# Patient Record
Sex: Male | Born: 1965 | Race: White | Hispanic: No | Marital: Married | Smoking: Former smoker
Health system: Southern US, Community
[De-identification: ages and names within clinical notes are randomized; demographics above are authoritative.]

## PROBLEM LIST (undated history)

## (undated) DIAGNOSIS — I2510 Atherosclerotic heart disease of native coronary artery without angina pectoris: Secondary | ICD-10-CM

## (undated) DIAGNOSIS — E785 Hyperlipidemia, unspecified: Secondary | ICD-10-CM

## (undated) DIAGNOSIS — I10 Essential (primary) hypertension: Secondary | ICD-10-CM

## (undated) DIAGNOSIS — I201 Angina pectoris with documented spasm: Secondary | ICD-10-CM

## (undated) HISTORY — DX: Atherosclerotic heart disease of native coronary artery without angina pectoris: I25.10

## (undated) HISTORY — PX: CARDIAC CATHETERIZATION: SHX172

## (undated) HISTORY — DX: Hyperlipidemia, unspecified: E78.5

## (undated) HISTORY — DX: Essential (primary) hypertension: I10

## (undated) HISTORY — DX: Angina pectoris with documented spasm: I20.1

---

## 2003-07-02 ENCOUNTER — Ambulatory Visit (HOSPITAL_COMMUNITY): Admission: RE | Admit: 2003-07-02 | Discharge: 2003-07-02 | Payer: Self-pay | Admitting: Internal Medicine

## 2003-07-02 ENCOUNTER — Encounter: Payer: Self-pay | Admitting: Internal Medicine

## 2003-12-07 ENCOUNTER — Encounter (HOSPITAL_COMMUNITY): Admission: RE | Admit: 2003-12-07 | Discharge: 2003-12-10 | Payer: Self-pay | Admitting: Internal Medicine

## 2004-12-02 ENCOUNTER — Ambulatory Visit: Payer: Self-pay | Admitting: Gastroenterology

## 2004-12-08 ENCOUNTER — Ambulatory Visit: Payer: Self-pay | Admitting: Gastroenterology

## 2008-06-13 ENCOUNTER — Ambulatory Visit: Payer: Self-pay | Admitting: Cardiology

## 2008-06-13 ENCOUNTER — Ambulatory Visit (HOSPITAL_COMMUNITY): Admission: RE | Admit: 2008-06-13 | Discharge: 2008-06-13 | Payer: Self-pay | Admitting: Cardiology

## 2008-06-14 ENCOUNTER — Ambulatory Visit: Payer: Self-pay | Admitting: Cardiology

## 2008-06-14 ENCOUNTER — Ambulatory Visit (HOSPITAL_COMMUNITY): Admission: RE | Admit: 2008-06-14 | Discharge: 2008-06-15 | Payer: Self-pay | Admitting: Cardiology

## 2009-02-20 ENCOUNTER — Encounter (HOSPITAL_COMMUNITY): Admission: RE | Admit: 2009-02-20 | Discharge: 2009-03-22 | Payer: Self-pay | Admitting: Cardiology

## 2009-02-20 ENCOUNTER — Ambulatory Visit: Payer: Self-pay | Admitting: Cardiology

## 2009-09-06 IMAGING — CR DG CERVICAL SPINE COMPLETE 4+V
6 series · 6 of 6 positions shown · non-contrast
Comparison: None

CLINICAL DATA: Neck pain

CERVICAL SPINE - COMPLETE 4+ VIEW

[view not recorded (1 of 6)]
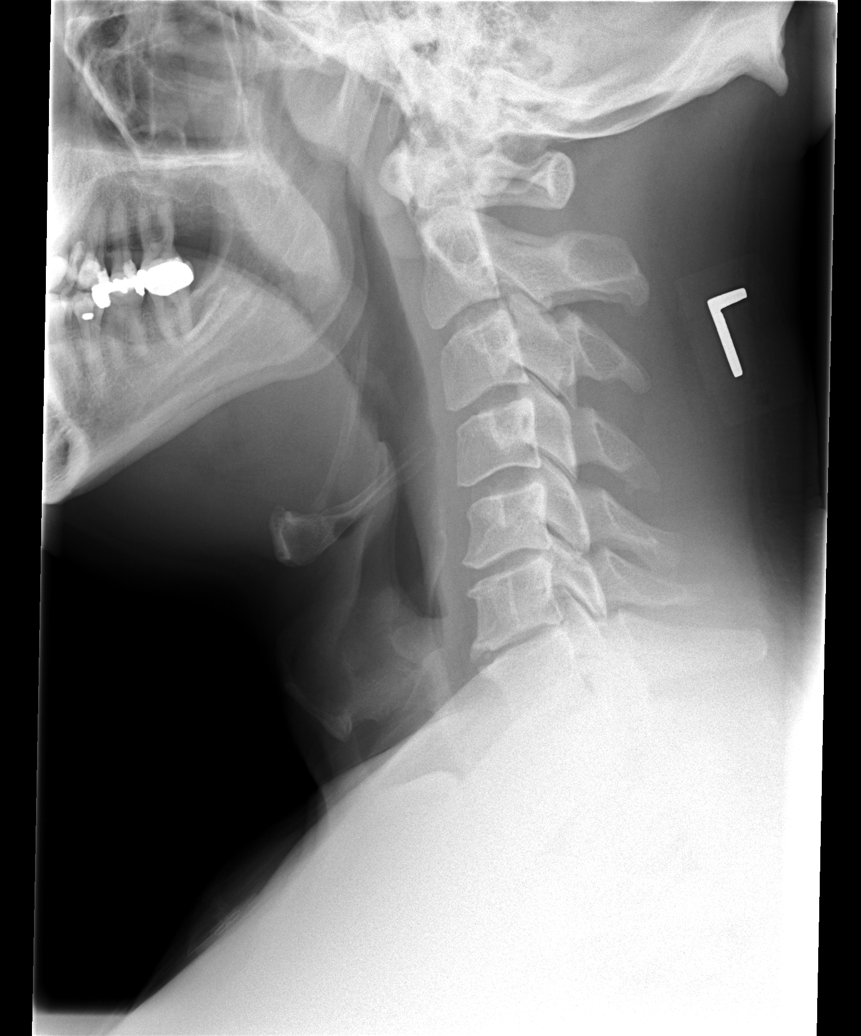

[view not recorded (2 of 6)]
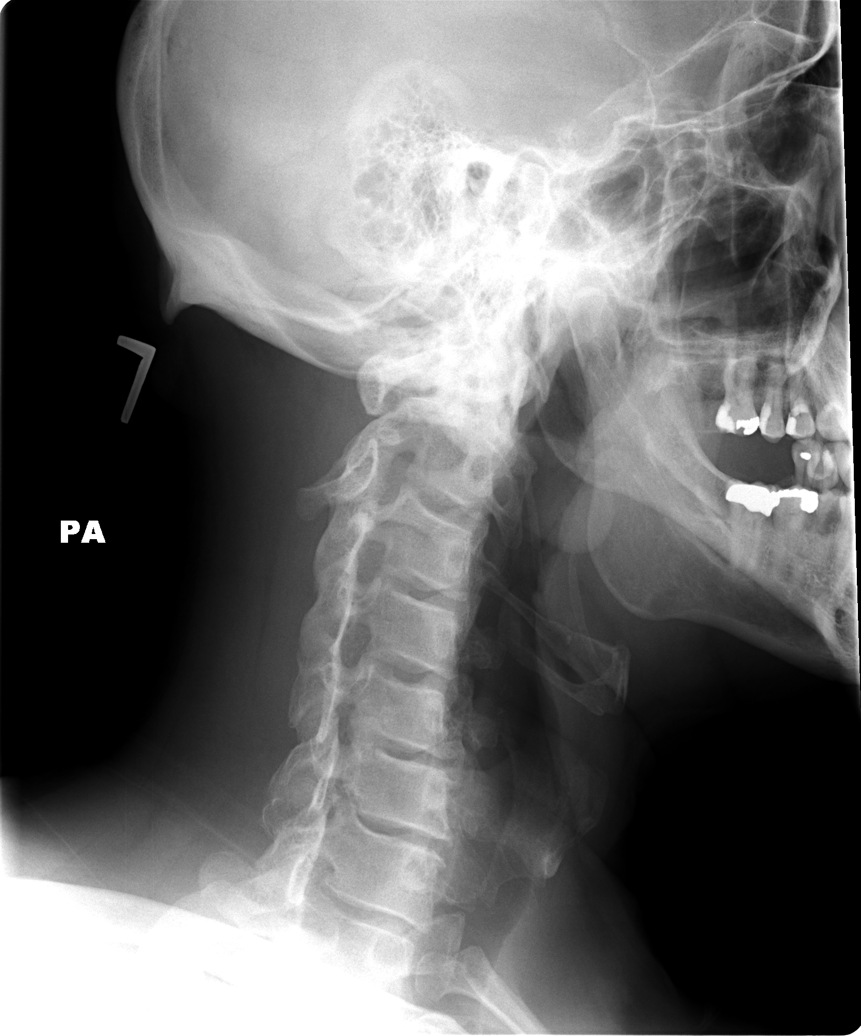

[view not recorded (3 of 6)]
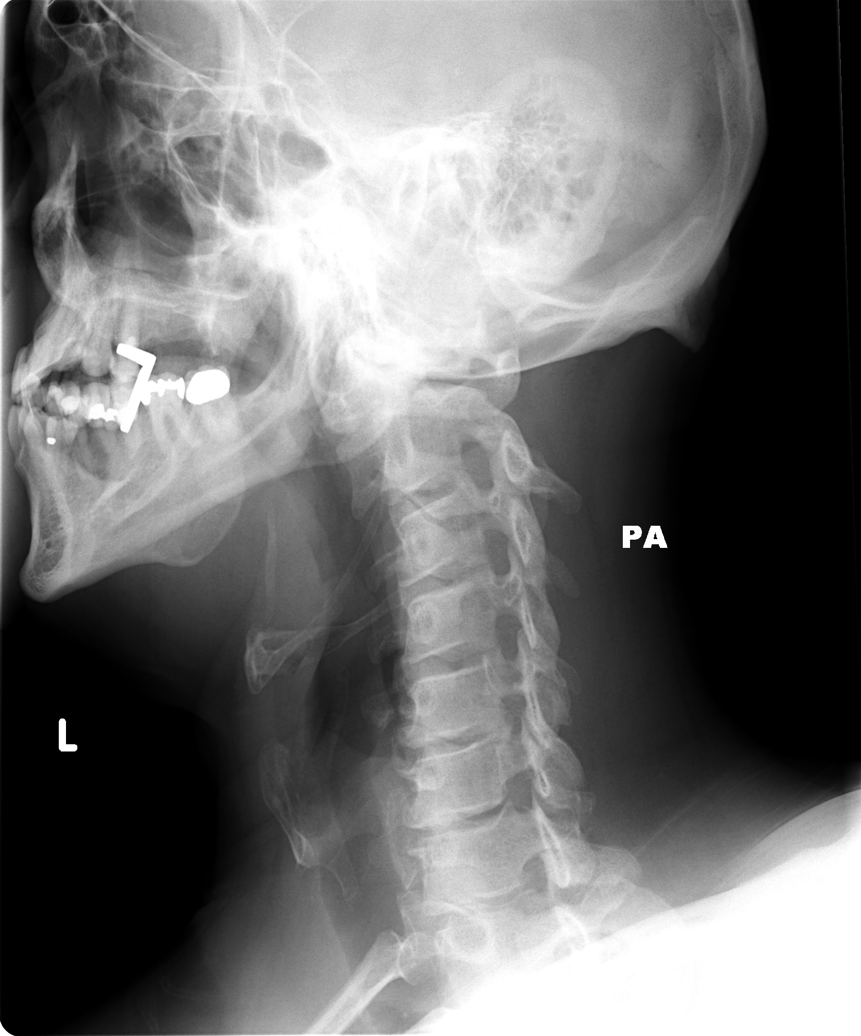

[view not recorded (4 of 6)]
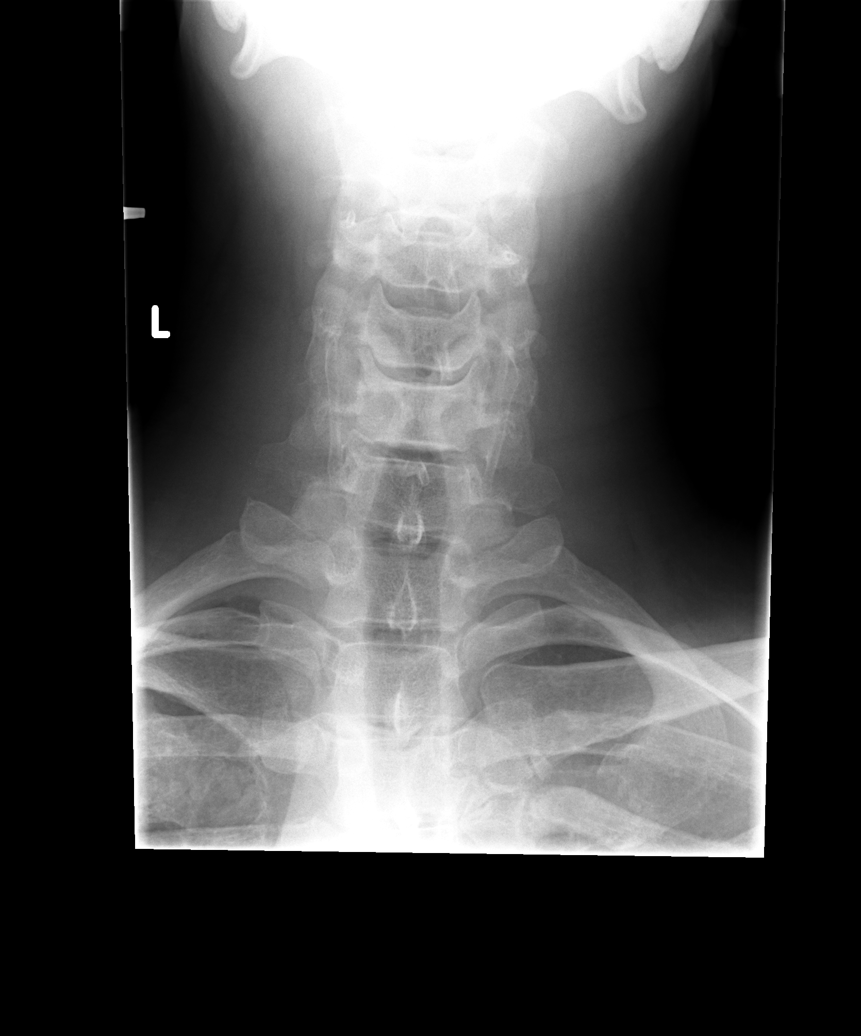

[view not recorded (5 of 6)]
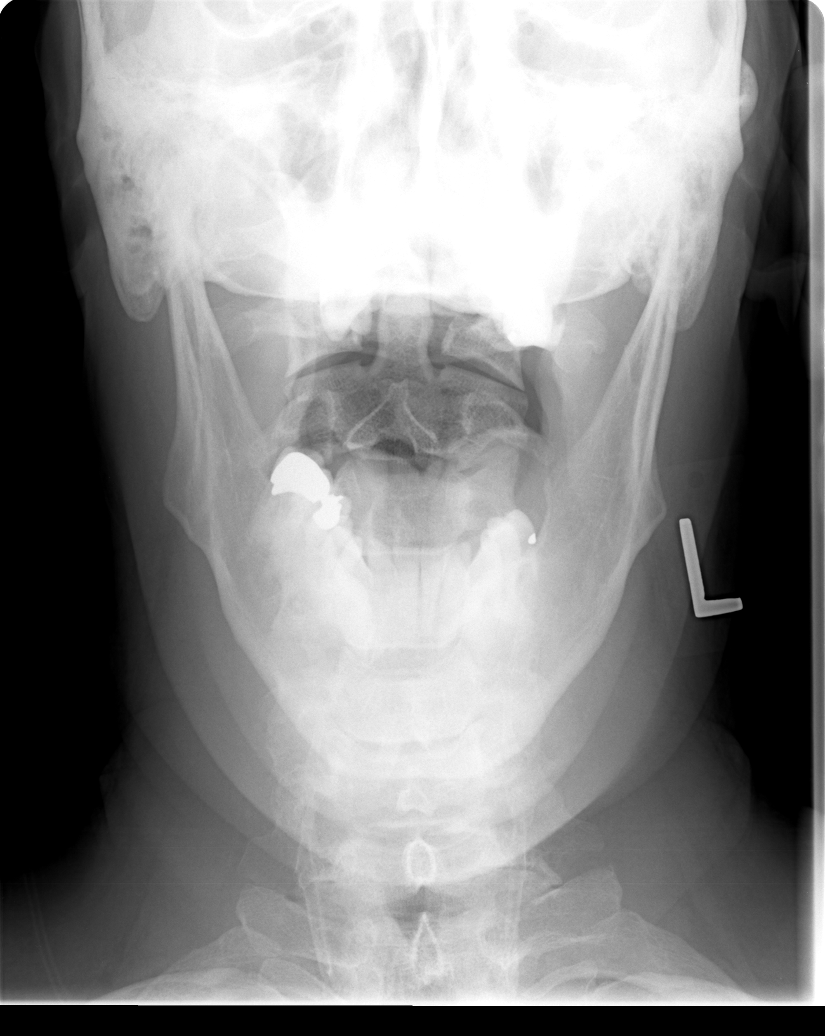

[view not recorded (6 of 6)]
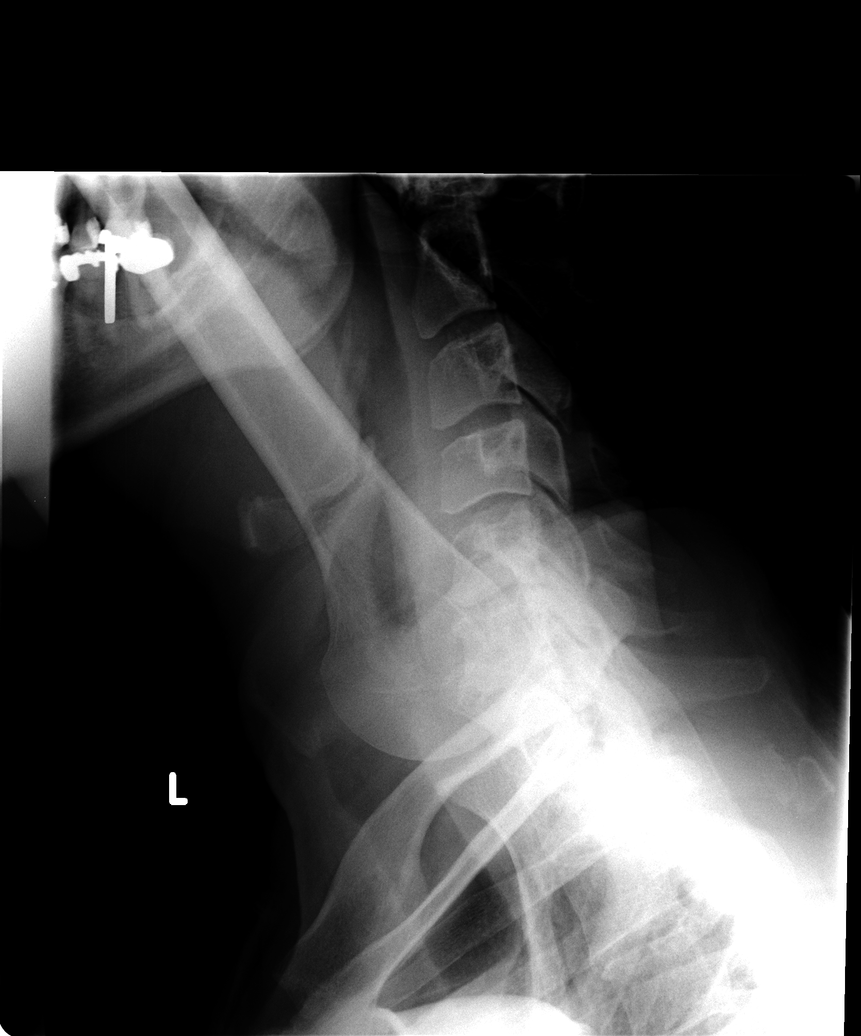

[6 of 6 positions shown; findings below may reference images not displayed]

FINDINGS: Mild reversal of cervical lordosis question muscle spasm.
Slight disc space narrowing at C5-C6 and C6-C7.
Minimally prominent adenoids.
Prevertebral soft tissues normal thickness.
Minimal narrowing of bilateral C5-C6 neural foramina by
uncovertebral hypertrophy.
No acute fracture, subluxation, or bone destruction.
Visualized lung apices clear.
C1-C2 alignment normal.
IMPRESSION: Mild degenerative changes of cervical spine as above.
Question muscle spasm.

## 2010-04-08 ENCOUNTER — Encounter (INDEPENDENT_AMBULATORY_CARE_PROVIDER_SITE_OTHER): Payer: Self-pay | Admitting: *Deleted

## 2010-07-16 ENCOUNTER — Encounter (INDEPENDENT_AMBULATORY_CARE_PROVIDER_SITE_OTHER): Payer: Self-pay | Admitting: *Deleted

## 2010-11-11 NOTE — Letter (Signed)
Summary: Appointment - Reminder 2  [facility] at [place]. [address] [place] [address]   Phone: [phone]  Fax: [phone]     April 08, 2010 MRN: [id number]   Riley Williams [address] [place], [place]  [address]   Dear [name],  Our records indicate that it is time to schedule a follow-up appointment.  Dr. Daleen Squibb         recommended that you follow up with [place] in    MAY 2011        . It is very important that we reach you to schedule this appointment. We look forward to participating in your health care needs. Please contact us at the number listed above at your earliest convenience to schedule your appointment.  If you are unable to make an appointment at this time, give [place] a call so we can update our records.     Sincerely,   Glass blower/designer

## 2010-11-11 NOTE — Letter (Signed)
Summary: Appointment - Reminder 2  [facility] [place]. [address] [place] [address]   Phone: [phone]  Fax: [phone]     July 16, 2010 MRN: [id number]   Riley Williams [address] [place], [place]  [address]   Dear [name],  Our records indicate that it is time to schedule a follow-up appointment.  Dr.   Daleen Squibb       recommended that you follow up with [place] in    02/2010        . It is very important that we reach you to schedule this appointment. We look forward to participating in your health care needs. Please contact us at the number listed above at your earliest convenience to schedule your appointment.  If you are unable to make an appointment at this time, give [place] a call so we can update our records.     Sincerely,   Glass blower/designer

## 2011-02-24 NOTE — H&P (Signed)
NAMEKYLAND, NO                ACCOUNT NO.:  [id number]   MEDICAL RECORD NO.:  [id number]         PATIENT TYPE:  COIB   LOCATION:  2027                          [facility]   PHYSICIAN:  Jesse Sans. Wall, MD, FACCDATE OF BIRTH:  01/20/1966   DATE OF ADMISSION:  06/14/2008  DATE OF DISCHARGE:                              HISTORY & PHYSICAL   CHIEF COMPLAINT:  Chest discomfort and shortness of breath.   HISTORY OF PRESENT ILLNESS:  Mr. Riley Williams [facility]) is a 45 year old  friend of mine, who called me this morning with the above complaints.  I  asked him to come to the office here in [place].   Over the past month, he has been having exertional chest tightness and  shortness of breath.  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   Yesterday he was riding his tractor in the field and suddenly became  very hot and felt nauseated.  He broke out in a sweat.  He said he lost  consciousness for about 4 or 5 seconds.  He thought he was going to die  in the field.  After that, he became nauseated and had diarrhea.  His  only change in his normal routine is that he had an energy drink about 3  or 4 hours before that.   He has multiple cardiac risk factors for coronary disease.  This  includes increasing age, male sex, family history with his father having  coronary disease at about age 42, tobacco use of a pack per day, but  heavier in the past, and some recreational drug use in the distant past.  He is not diabetic and does not have hypertension.  His cholesterol he  thinks is okay.Marland Kitchen   PAST MEDICAL HISTORY:  He had a colonoscopy in the past.   ALLERGIES:  He has no known drug allergies.   MEDICATIONS:  He is currently on no medications.   SOCIAL HISTORY:  He is married and has one child.  Lives in [place].  He enjoys hunting and golf.  He is an Financial trader.   FAMILY HISTORY:  His father has had bypass surgery and coronary  stents.  His mother is alive and well with no coronary disease.  His brother also  has no history of coronary disease, but does have a history of SVT and a  heart murmur.  Sister is alive and well.   REVIEW OF SYSTEMS:  Negative other than the HPI.  He occasionally will  have some palpitations.  All systems were reviewed.   PHYSICAL EXAMINATION:  GENERAL:  He is a well-developed, well-nourished  muscular male in no acute distress.  VITAL SIGNS:  His blood pressure is 150/90, his pulse is 70 and regular,  his weight is 264, he is 6 feet 5.  HEENT: Normocephalic, atraumatic.  PERRLA.  Extraocular movements  intact.  Sclerae are clear.  Facial symmetry is normal.  Carotids  upstrokes are equal bilaterally without bruits.  No JVD.  Thyroid is not  enlarged.  Trachea is midline.  LUNGS:  Clear to auscultation and percussion.  HEART:  Reveals a nondisplaced PMI.  There is a normal S1 and S2.  No  murmur, rub, or gallop.  ABDOMEN:  Soft.  No midline bruit.  No pulsatile mass.  No hepatomegaly.  EXTREMITIES:  No cyanosis, clubbing, or edema.  Pulses are intact.  NEURO EXAM:  Intact.  SKIN:  Unremarkable.   EKG shows normal sinus rhythm with no ST-segment changes.  He has a  significant sinus arrhythmia and occasional PAC.   ASSESSMENT:  1. Exertional angina, rule out obstructive coronary artery disease.  2. Multiple cardiac risk stress test for coronary disease.  See above      notes.   PLAN:  Outpatient cardiac catheterization at [facility] tomorrow.  Indications, risks, and potential benefits have been discussed.  I  started him on an aspirin today.  He knows that if he has an episode of  this this evening that he should call 911.      Thomas C. Daleen Squibb, MD, [facility]  Electronically Signed     TCW/MEDQ  D:  06/13/2008  T:  06/13/2008  Job:  [id number]   cc:   Kingsley Callander. Ouida Sills, MD

## 2011-02-24 NOTE — Procedures (Signed)
[facility]                              EXERCISE TREADMILL   Riley, Williams                       MRN:          [id number]  DATE:02/20/2009                            DOB:          1966/04/05    INDICATIONS FOR STUDY:  Chest discomfort 786.59.  He has a history of  catheter-induced ostial right coronary artery spasm by cath, September  2009.   Baseline blood pressure was 122/72.   Baseline EKG was normal.   He exercised for 9 minutes on an accelerated protocol to achieve higher  heart rate.  His max MET level was 13.4.  His peak heart rate was only  136 which is 76% of maximum predicted heart rate.  Blood pressure  increased appropriately to 178/78.  The test was stopped secondary to  fatigue.  He reported no chest pain or arm pain.   His EKG was completely normal.   CONCLUSION:  1. Negative and adequate exercise treadmill.  2. No symptoms at peak exercise nor EKG changes.  3. Excellent physical conditioning.  4. Good pressure control at rest and with exercise.  5. Myoview images pending.     Thomas C. Daleen Squibb, MD, [facility]  Electronically Signed    TCW/MedQ  DD: 02/20/2009  DT: 02/21/2009  Job #: [id number]   cc:   Kingsley Callander. Ouida Sills, MD

## 2011-02-24 NOTE — Cardiovascular Report (Signed)
NAMESOSAIA, PITTINGER                ACCOUNT NO.:  [id number]   MEDICAL RECORD NO.:  [id number]          PATIENT TYPE:  OIB   LOCATION:  2027                         FACILITY:  [facility]   PHYSICIAN:  Arturo Morton. Riley Kill, MD, FACCDATE OF BIRTH:  07-10-1966   DATE OF PROCEDURE:  06/14/2008  DATE OF DISCHARGE:                            CARDIAC CATHETERIZATION   INDICATIONS:  Mr. Riley Williams is a 45 year old who had chest pain and episode  of syncope.  The current study was done to assess coronary anatomy.  He  was seen by Dr. Daleen Squibb in consultation and set up for the study.  Risks,  benefits, and alternatives were discussed with the patient who consented  to proceed.   PROCEDURES:  1. Left heart catheterization.  2. Selective coronary arteriography.  3. Selective left ventriculography.   DESCRIPTION OF PROCEDURE:  The patient was brought to the  catheterization laboratory and prepped and draped in usual fashion.  Through an anterior puncture, the femoral artery was easily entered.  A  5-French sheath was then placed.  Following this, views of the left  coronary artery were obtained.  Following injection of the right  coronary artery, he had modest narrowing in the proximal vessel and the  second injection suggested a 95% stenosis suggesting dynamic change in  the coronary vessel.  Intracoronary nitroglycerin was then given in 2  doses of 150 mcg apiece.  With this, the vessel completely normalized  and there was wide flow down the vessel.  The central aortic and left  ventricular pressures were measured with pigtail and ventriculography  was then performed in the RAO projection.  Following a pressure  pullback, the pigtail catheter was subsequently removed.  It was taken  to the holding area in satisfactory clinical condition for sheath  removal.  Following this, I reviewed the films in detail with the  patient's family.  There were no complications.   HEMODYNAMIC DATA:  1. Central aortic  pressure 124/76, mean 99.  2. Left ventricular pressure 113/8.  3. There was no gradient or pullback across the aortic valve.   ANGIOGRAPHIC DATA:  1. Ventriculography was done in the RAO projection.  Overall systolic      function was preserved.  Ejection fraction was in excess of 55%      without wall motion abnormality.  2. The left main is short and without significant obstruction.  There      were functionally separate ostia.  3. The left anterior descending artery crosses to the apex.  There is      some very mild area of perhaps some bridging as well as mild      irregularity in the mid-to-distal vessel of perhaps 20% past the      diagonal.  The diagonal itself is without critical narrowing.  No      areas of high-grade obstruction are noted.  4. The circumflex coronary artery provides a very large marginal      branch which has segmental plaquing of about 20-30% over a modest      area in the proximal marginal.  The  marginal then opens up and is      widely patent.  The AV circumflex provides a posterolateral branch,      which also was without significant focal obstruction.  5. The right coronary artery is a moderate-sized vessel.  After      intracoronary nitroglycerin was administered, it is a very large-      caliber vessel that is widely patent.  On the initial injection,      there was about 20-30% segmental narrowing and following injection,      there was nearly 90% narrowing which then opened up following      intracoronary nitroglycerin suggesting coronary artery spasm.   CONCLUSION:  1. Well-preserved left ventricular function.  2. Right coronary artery spasm following contrast injection into the      right coronary artery, completely relieved by intracoronary      nitroglycerin.  3. Scattered mild irregularities of the left coronary system as      described above.   DISPOSITION:  I have discussed the case with Dr. Daleen Squibb.  We will add a  low-dose calcium channel  antagonist.  In addition, potentially, the  patient absolutely needs to stop smoking.  He will be followed closely  by Dr. Daleen Squibb.      Arturo Morton. Riley Kill, MD, [facility]  Electronically Signed     TDS/MEDQ  D:  06/14/2008  T:  06/15/2008  Job:  [id number]   cc:   Thomas C. Daleen Squibb, MD, [facility]  Kingsley Callander. Ouida Sills, MD  CV Laboratory

## 2011-02-24 NOTE — Discharge Summary (Signed)
Riley Williams, Riley Williams                ACCOUNT NO.:  [id number]   MEDICAL RECORD NO.:  [id number]          PATIENT TYPE:  OIB   LOCATION:  2027                         FACILITY:  [facility]   PHYSICIAN:  Jesse Sans. Wall, MD, FACCDATE OF BIRTH:  1965-10-29   DATE OF ADMISSION:  06/14/2008  DATE OF DISCHARGE:  06/15/2008                               DISCHARGE SUMMARY   PRIMARY CARDIOLOGIST:  Maisie Fus C. Wall, MD, [facility].   CONSULTING CARDIOLOGIST:  Arturo Morton. Riley Kill, MD, [facility].   PRIMARY CARE PHYSICIAN:  Kingsley Callander. Ouida Sills, MD.   PROCEDURES PERFORMED DURING HOSPITALIZATION:  Cardiac catheterization  completed by Dr. Shawnie Pons revealing coronary artery spasm of the  right coronary artery, released with intracoronary nitroglycerin.   FINAL DISCHARGE DIAGNOSES:  1. Nonobstructive coronary artery disease.  2. Vasospasm of the right coronary artery.  3. Elevated lipids.  4. Hypertension.   HOSPITAL COURSE:  This is a 45 year old male with complaints of chest  discomfort and shortness of breath with multiple cardiovascular risk  factors.  The patient was seen by Dr. Juanito Doom in the office on  June 13, 2008, secondary to these symptoms and was found to be a  candidate for cardiac catheterization to rule out obstructive coronary  artery disease causing symptoms.  The patient was seen and examined by  Dr. Bonnee Quin on admission and did undergo cardiac catheterization on  June 14, 2008.  The patient was found to have a right coronary  artery spasm in the proximal portion, which was relieved with an  intracoronary nitroglycerin, reducing it from 95% to 0%.  The patient  was subsequently placed on amlodipine and nitroglycerin p.r.n.  The  patient has a history of tobacco abuse, and a nicotine patch was also  prescribed on discharge.  The patient was found to be feeling much  better and had no further complaints.  He was seen and examined by Dr.  Daleen Squibb on day of discharge and found to be stable.   The patient will  follow up with Dr. Daleen Squibb in [place] on Friday, June 22, 2008.   MOST RECENT LABORATORY:  Hemoglobin 15.0, hematocrit 43.6, white blood  cells 8.6, platelets 195.  Glucose 28, BUN 10, creatinine 0.93.  LFTs  within normal limits.  Sodium 139, potassium 4.0, chloride 107.   DISCHARGE MEDICATIONS:  1. Amlodipine 2.5 mg daily.  2. Crestor 20 mg daily at bedtime.  3. Enteric-coated aspirin 81 mg daily.  4. Nitroglycerin 0.4 mg p.r.n. chest pain.  5. Nicotine patch 21 mg transdermally.   ALLERGIES:  No known drug allergies.   FOLLOWUP PLANS AND APPOINTMENTS:  1. The patient has been given postcardiac catheterization instructions      with particular emphasis on the right groin site for evidence of      bleeding, hematoma, or signs of infection.  2. The patient is to follow with Dr. Juanito Doom on June 22, 2008,      at 2:30 p.m. in [place].  3. The patient is to follow up with Dr. Ouida Sills, primary care physician,      for  continued medical management.   Time spent with the patient to include physician time 30 minutes.      Bettey Mare. Lyman Bishop, NP      Jesse Sans. Daleen Squibb, MD, [facility]  Electronically Signed    KML/MEDQ  D:  06/15/2008  T:  06/16/2008  Job:  [id number]   cc:   Kingsley Callander. Ouida Sills, MD

## 2011-02-27 NOTE — Procedures (Signed)
NAMEHASSEN, Riley Williams                          ACCOUNT NO.:  [id number]   MEDICAL RECORD NO.:  [id number]                  PATIENT TYPE:  PREC   LOCATION:                                       FACILITY:   PHYSICIAN:  Kingsley Callander. Ouida Sills, M.D.                  DATE OF BIRTH:   DATE OF PROCEDURE:  12/07/2003  DATE OF DISCHARGE:                                    STRESS TEST   CARDIOLITE STRESS TEST   DESCRIPTION:  [name] exercised 11 minutes (2 minutes into stage 4 of the  Bruce protocol) attaining a maximal heart rate of 168 (92% of the age  predicted maximal heart rate) at a work load of 12.8 METs and discontinued  exercise after surpassing his target heart rate.  There were no symptoms of  chest pain.  There were infrequent ventricular premature complexes.  There  was a hypertensive blood pressure response to exercise, reaching a peak of  210/100.  Resting hypertension was also present at 152/90.  The baseline  electrocardiogram revealed normal sinus rhythm.   IMPRESSION:  No evidence of exercise-induced ischemia; Cardiolite images  pending.      ___________________________________________                                            Kingsley Callander. Ouida Sills, M.D.   ROF/MEDQ  D:  12/07/2003  T:  12/07/2003  Job:  [id number]

## 2011-07-07 ENCOUNTER — Other Ambulatory Visit: Payer: Self-pay | Admitting: *Deleted

## 2011-07-07 MED ORDER — NITROGLYCERIN 0.4 MG SL SUBL: 0.4000 mg | SUBLINGUAL_TABLET | SUBLINGUAL | Status: AC | PRN

## 2011-07-13 ENCOUNTER — Encounter: Payer: Self-pay | Admitting: Cardiology

## 2011-07-14 ENCOUNTER — Encounter: Payer: Self-pay | Admitting: *Deleted

## 2011-07-14 ENCOUNTER — Ambulatory Visit (INDEPENDENT_AMBULATORY_CARE_PROVIDER_SITE_OTHER): Payer: Self-pay | Admitting: Cardiology

## 2011-07-14 ENCOUNTER — Encounter: Payer: Self-pay | Admitting: Cardiology

## 2011-07-14 VITALS — BP 142/96 | HR 70 | Ht 76.0 in | Wt 283.0 lb

## 2011-07-14 DIAGNOSIS — I2510 Atherosclerotic heart disease of native coronary artery without angina pectoris: Secondary | ICD-10-CM | POA: Insufficient documentation

## 2011-07-14 DIAGNOSIS — I10 Essential (primary) hypertension: Secondary | ICD-10-CM | POA: Insufficient documentation

## 2011-07-14 DIAGNOSIS — I208 Other forms of angina pectoris: Secondary | ICD-10-CM | POA: Insufficient documentation

## 2011-07-14 DIAGNOSIS — I209 Angina pectoris, unspecified: Secondary | ICD-10-CM

## 2011-07-14 MED ORDER — CARVEDILOL 12.5 MG PO TABS: 12.5000 mg | ORAL_TABLET | Freq: Two times a day (BID) | ORAL | Status: AC

## 2011-07-14 NOTE — Progress Notes (Signed)
HPI Riley Williams comes in today because of exertional chest tightness and shortness of breath. It also radiates into his left arm. He does not always occur with exercise, in fact, he can work out at gym and not have as much trouble. He seems to occur with short burst of activity.  He called them done and I started him on aspirin. Also prescribe sublingual nitroglycerin and instructed him on its use.  He has not obstructive coronary disease with normal LV function by previous cath a couple years ago.  He quit smoking several months ago. As a consequence, he's gained about 40 pounds.  He's very active.  He also was given something for his blood pressure dropped years ago but is not taking it. As I recall it with Norvasc.  EKG shows normal sinus rhythm with left axis deviation. Past Medical History  Diagnosis Date  . Elevated lipids   . Hypertension   . Coronary artery disease     nonobstructive  . Coronary artery vasospasm     Past Surgical History  Procedure Date  . Cardiac catheterization     Family History  Problem Relation Age of Onset  . Other Father     had bypass surgery  . Supraventricular tachycardia Brother     hx of - heart murmur    History   Social History  . Marital Status: Married    Spouse Name: N/A    Number of Children: 1  . Years of Education: N/A   Occupational History  . excavator    Social History Main Topics  . Smoking status: Former Games developer  . Smokeless tobacco: Not on file  . Alcohol Use: No  . Drug Use: No  . Sexually Active: Not on file   Other Topics Concern  . Not on file   Social History Narrative  . No narrative on file    No Known Allergies  Current Outpatient Prescriptions  Medication Sig Dispense Refill  . aspirin 81 MG tablet Take 81 mg by mouth daily.        . nitroGLYCERIN (NITROSTAT) 0.4 MG SL tablet Place 1 tablet (0.4 mg total) under the tongue every 5 (five) minutes as needed for chest pain.  25 tablet  8     ROS Negative other than HPI.   PE General Appearance: well developed, well nourished in no acute distress HEENT: symmetrical face, PERRLA, good dentition  Neck: no JVD, thyromegaly, or adenopathy, trachea midline Chest: symmetric without deformity Cardiac: PMI non-displaced, RRR, normal S1, S2, no gallop or murmur Lung: clear to ausculation and percussion Vascular: all pulses full without bruits  Abdominal: nondistended, nontender, good bowel sounds, no HSM, no bruits Extremities: no cyanosis, clubbing or edema, no sign of DVT, no varicosities  Skin: normal color, no rashes Neuro: alert and oriented x 3, non-focal Pysch: normal affect Filed Vitals:   07/14/11 0918  BP: 142/96  Pulse: 70  Height: 6\' 4"  (1.93 m)  Weight: 283 lb (128.368 kg)    EKG  Labs and Studies Reviewed.   No results found for this basename: WBC, HGB, HCT, MCV, PLT      Chemistry   No results found for this basename: NA, K, CL, CO2, BUN, CREATININE, GLU   No results found for this basename: CALCIUM, ALKPHOS, AST, ALT, BILITOT       No results found for this basename: CHOL   No results found for this basename: HDL   No results found for this basename: LDLCALC  No results found for this basename: TRIG   No results found for this basename: CHOLHDL   No results found for this basename: HGBA1C   No results found for this basename: ALT, AST, GGT, ALKPHOS, BILITOT   No results found for this basename: TSH

## 2011-07-14 NOTE — Patient Instructions (Addendum)
Your physician has requested that you have a cardiac catheterization. Cardiac catheterization is used to diagnose and/or treat various heart conditions. Doctors may recommend this procedure for a number of different reasons. The most common reason is to evaluate chest pain. Chest pain can be a symptom of coronary artery disease (CAD), and cardiac catheterization can show whether plaque is narrowing or blocking your heart's arteries. This procedure is also used to evaluate the valves, as well as measure the blood flow and oxygen levels in different parts of your heart. For further information please visit [email or web address] Please follow instruction sheet, as given.  Your physician recommends that you return for lab work at the [place] office on July 21, 2011  Your physician recommends that you have a chest xray at the [place] office on October 9,2012 Prior to your catheterization  Your physician has recommended you make the following change in your medication:  start Carvedilol

## 2011-07-21 ENCOUNTER — Other Ambulatory Visit (INDEPENDENT_AMBULATORY_CARE_PROVIDER_SITE_OTHER): Payer: BC Managed Care – PPO

## 2011-07-21 ENCOUNTER — Ambulatory Visit (INDEPENDENT_AMBULATORY_CARE_PROVIDER_SITE_OTHER)
Admission: RE | Admit: 2011-07-21 | Discharge: 2011-07-21 | Disposition: A | Discharge status: Home / Self Care | Payer: BC Managed Care – PPO | Source: Ambulatory Visit | Attending: Cardiology | Admitting: Cardiology

## 2011-07-21 DIAGNOSIS — I209 Angina pectoris, unspecified: Secondary | ICD-10-CM

## 2011-07-21 DIAGNOSIS — I2510 Atherosclerotic heart disease of native coronary artery without angina pectoris: Secondary | ICD-10-CM

## 2011-07-21 LAB — CBC WITH DIFFERENTIAL/PLATELET
Basophils Absolute: 0.1 10*3/uL (ref 0.0–0.1)
Eosinophils Absolute: 0.5 10*3/uL (ref 0.0–0.7)
Hemoglobin: 14.6 g/dL (ref 13.0–17.0)
Lymphocytes Relative: 41.8 % (ref 12.0–46.0)
MCHC: 33.7 g/dL (ref 30.0–36.0)
Monocytes Relative: 6.4 % (ref 3.0–12.0)
Neutrophils Relative %: 42.8 % — ABNORMAL LOW (ref 43.0–77.0)
RDW: 12.1 % (ref 11.5–14.6)

## 2011-07-21 LAB — PROTIME-INR
INR: 1 ratio (ref 0.8–1.0)
Prothrombin Time: 11.1 s (ref 10.2–12.4)

## 2011-07-21 LAB — BASIC METABOLIC PANEL
CO2: 27 mEq/L (ref 19–32)
Chloride: 107 mEq/L (ref 96–112)
Creatinine, Ser: 0.8 mg/dL (ref 0.4–1.5)
Potassium: 4.5 mEq/L (ref 3.5–5.1)
Sodium: 140 mEq/L (ref 135–145)

## 2011-07-24 ENCOUNTER — Ambulatory Visit (HOSPITAL_COMMUNITY)
Admission: RE | Admit: 2011-07-24 | Discharge: 2011-07-24 | Disposition: A | Discharge status: Home / Self Care | Payer: BC Managed Care – PPO | Source: Ambulatory Visit | Attending: Cardiovascular Disease | Admitting: Cardiovascular Disease

## 2011-07-24 DIAGNOSIS — R0989 Other specified symptoms and signs involving the circulatory and respiratory systems: Secondary | ICD-10-CM | POA: Insufficient documentation

## 2011-07-24 DIAGNOSIS — I10 Essential (primary) hypertension: Secondary | ICD-10-CM | POA: Insufficient documentation

## 2011-07-24 DIAGNOSIS — I2510 Atherosclerotic heart disease of native coronary artery without angina pectoris: Secondary | ICD-10-CM

## 2011-07-24 DIAGNOSIS — R0609 Other forms of dyspnea: Secondary | ICD-10-CM | POA: Insufficient documentation

## 2011-07-28 ENCOUNTER — Telehealth: Payer: Self-pay | Admitting: *Deleted

## 2011-07-28 ENCOUNTER — Other Ambulatory Visit: Payer: Self-pay | Admitting: Cardiology

## 2011-07-28 DIAGNOSIS — I2510 Atherosclerotic heart disease of native coronary artery without angina pectoris: Secondary | ICD-10-CM

## 2011-07-28 MED ORDER — ATORVASTATIN CALCIUM 20 MG PO TABS: 20.0000 mg | ORAL_TABLET | Freq: Every day | ORAL | Status: AC

## 2011-07-28 NOTE — Telephone Encounter (Signed)
I spoke with pt and he will start Atorvastatin tonight.  He will return for fasting lipid and liver on 09/29/11. Appt reminder with instructions mailed to pt. Mylo Red RN

## 2011-08-02 NOTE — Cardiovascular Report (Signed)
NAMEOSHER, Riley Williams                ACCOUNT NO.:  [id number]  MEDICAL RECORD NO.:  [id number]  LOCATION:  [facility]                         FACILITY:  [facility]  PHYSICIAN:  Veverly Fells. Excell Seltzer, MD  DATE OF BIRTH:  01-25-1966  DATE OF PROCEDURE:  07/24/2011 DATE OF DISCHARGE:                           CARDIAC CATHETERIZATION   PROCEDURE: 1. Left heart catheterization. 2. Selective coronary angiography. 3. Left ventricular angiography.  PROCEDURAL INDICATION:  Riley Williams is a 45 year old gentleman with documented nonobstructive CAD.  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  He presented with exertional dyspnea and chest tightness and was referred back for cardiac catheterization.  His last study was in 2009, and these films were reviewed for comparison.  Risks and indications of procedure were reviewed with the patient. Informed consent was obtained.  The right wrist was prepped, draped and anesthetized with 1% lidocaine.  Using the modified Seldinger technique, a 5-French sheath was placed in the right radial artery.  Verapamil 3 mg was administered through the sheath, 5000 units of heparin was administered intravenously.  Standard Judkins catheters were used for coronary angiography and left ventriculography.  The patient tolerated the procedure well.  There were no immediate complications.  PROCEDURAL FINDINGS:  Aortic pressure is 118/78 with a mean of 97, left ventricular pressure is 125/11. Left ventriculography:  LV function is normal.  The ejection fraction is estimated at 55%.  There are no regional wall motion abnormalities. Left Main:  The left main is widely patent.  There is no obstructive disease. Left circumflex:  The left circumflex is a large vessel.  The AV groove circumflex is small after the first OM, but the first OM is a very large branch. The obtuse marginal on the initial images had some vasospasm in the mid segment.  This was relieved as further angiographic  images were obtained.  I would estimate the diameter stenosis in the range of 30- 40%.  Initially, it appeared to be approximately 50%, but it is clearly nonobstructive as further imaging was done. LAD:  The LAD is widely patent.  There is no obstructive disease present.  It wraps around the LV apex.  There are 3 diagonals with no obstructive disease. Right coronary artery:  The RCA is a moderate caliber, dominant vessel. There is no obstructive disease seen throughout the course of the vessel.  It supplies a PDA, acute marginal, and PL branch.  All of these branches are patent.  FINAL ASSESSMENT: 1. 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 2. Wide patency of the left anterior descending and right coronary     artery. 3. Normal left ventricular function.  RECOMMENDATIONS:  Would continue with medical therapy.  The patient has no significant obstructive CAD.  Consider the addition of a statin as there may be slight progression of disease in the left circumflex compared to the 2009 films.  Further followup per Dr. Daleen Squibb.     Veverly Fells. Excell Seltzer, MD     MDC/MEDQ  D:  07/24/2011  T:  07/24/2011  Job:  [id number]  cc:   Thomas C. Wall, MD, [facility]  Electronically Signed by Tonny Bollman  MD on 08/02/2011 01:15:58 AM

## 2011-09-29 ENCOUNTER — Other Ambulatory Visit (INDEPENDENT_AMBULATORY_CARE_PROVIDER_SITE_OTHER): Payer: BC Managed Care – PPO | Admitting: *Deleted

## 2011-09-29 ENCOUNTER — Other Ambulatory Visit: Payer: Self-pay | Admitting: Cardiology

## 2011-09-29 DIAGNOSIS — I2510 Atherosclerotic heart disease of native coronary artery without angina pectoris: Secondary | ICD-10-CM

## 2011-09-29 LAB — HEPATIC FUNCTION PANEL
AST: 40 U/L — ABNORMAL HIGH (ref 0–37)
Albumin: 4.5 g/dL (ref 3.5–5.2)
Alkaline Phosphatase: 49 U/L (ref 39–117)
Bilirubin, Direct: 0 mg/dL (ref 0.0–0.3)
Total Bilirubin: 0.7 mg/dL (ref 0.3–1.2)

## 2011-09-29 LAB — LIPID PANEL
HDL: 39 mg/dL — ABNORMAL LOW (ref >39.00)
Total CHOL/HDL Ratio: 6
VLDL: 56.4 mg/dL — ABNORMAL HIGH (ref 0.0–40.0)

## 2014-05-21 ENCOUNTER — Other Ambulatory Visit: Payer: Self-pay | Admitting: *Deleted

## 2014-05-21 DIAGNOSIS — G459 Transient cerebral ischemic attack, unspecified: Secondary | ICD-10-CM

## 2014-05-22 ENCOUNTER — Ambulatory Visit (HOSPITAL_COMMUNITY)
Admission: RE | Admit: 2014-05-22 | Discharge: 2014-05-22 | Disposition: A | Discharge status: Home / Self Care | Payer: BC Managed Care – PPO | Source: Ambulatory Visit | Attending: Cardiology | Admitting: Cardiology

## 2014-05-22 DIAGNOSIS — G459 Transient cerebral ischemic attack, unspecified: Secondary | ICD-10-CM

## 2014-05-22 DIAGNOSIS — I6529 Occlusion and stenosis of unspecified carotid artery: Secondary | ICD-10-CM | POA: Insufficient documentation

## 2017-07-19 DIAGNOSIS — L237 Allergic contact dermatitis due to plants, except food: Secondary | ICD-10-CM | POA: Diagnosis not present

## 2017-07-19 DIAGNOSIS — Z23 Encounter for immunization: Secondary | ICD-10-CM | POA: Diagnosis not present

## 2018-01-03 DIAGNOSIS — Z125 Encounter for screening for malignant neoplasm of prostate: Secondary | ICD-10-CM | POA: Diagnosis not present

## 2018-01-03 DIAGNOSIS — E119 Type 2 diabetes mellitus without complications: Secondary | ICD-10-CM | POA: Diagnosis not present

## 2018-01-03 DIAGNOSIS — Z0001 Encounter for general adult medical examination with abnormal findings: Secondary | ICD-10-CM | POA: Diagnosis not present

## 2018-01-10 DIAGNOSIS — Z0001 Encounter for general adult medical examination with abnormal findings: Secondary | ICD-10-CM | POA: Diagnosis not present

## 2018-01-10 DIAGNOSIS — R945 Abnormal results of liver function studies: Secondary | ICD-10-CM | POA: Diagnosis not present

## 2018-01-10 DIAGNOSIS — R7301 Impaired fasting glucose: Secondary | ICD-10-CM | POA: Diagnosis not present

## 2018-01-10 DIAGNOSIS — E785 Hyperlipidemia, unspecified: Secondary | ICD-10-CM | POA: Diagnosis not present

## 2018-04-07 DIAGNOSIS — R945 Abnormal results of liver function studies: Secondary | ICD-10-CM | POA: Diagnosis not present

## 2018-04-07 DIAGNOSIS — R7301 Impaired fasting glucose: Secondary | ICD-10-CM | POA: Diagnosis not present

## 2018-04-11 DIAGNOSIS — K760 Fatty (change of) liver, not elsewhere classified: Secondary | ICD-10-CM | POA: Diagnosis not present

## 2019-06-13 DIAGNOSIS — K760 Fatty (change of) liver, not elsewhere classified: Secondary | ICD-10-CM | POA: Diagnosis not present

## 2019-06-13 DIAGNOSIS — R7301 Impaired fasting glucose: Secondary | ICD-10-CM | POA: Diagnosis not present

## 2019-06-13 DIAGNOSIS — R945 Abnormal results of liver function studies: Secondary | ICD-10-CM | POA: Diagnosis not present

## 2019-06-13 DIAGNOSIS — E785 Hyperlipidemia, unspecified: Secondary | ICD-10-CM | POA: Diagnosis not present

## 2019-07-14 ENCOUNTER — Encounter: Payer: Self-pay | Admitting: Internal Medicine

## 2019-10-02 DIAGNOSIS — T162XXA Foreign body in left ear, initial encounter: Secondary | ICD-10-CM | POA: Diagnosis not present
